# Patient Record
Sex: Male | Born: 1965 | Smoking: Never smoker
Health system: Southern US, Community
[De-identification: ages and names within clinical notes are randomized; demographics above are authoritative.]

## PROBLEM LIST (undated history)

## (undated) DIAGNOSIS — E119 Type 2 diabetes mellitus without complications: Secondary | ICD-10-CM

---

## 2015-06-11 ENCOUNTER — Emergency Department (HOSPITAL_COMMUNITY)
Admission: EM | Admit: 2015-06-11 | Discharge: 2015-06-12 | Disposition: A | Discharge status: Home / Self Care | Payer: Self-pay | Attending: Emergency Medicine | Admitting: Emergency Medicine

## 2015-06-11 ENCOUNTER — Encounter (HOSPITAL_COMMUNITY): Payer: Self-pay | Admitting: Family Medicine

## 2015-06-11 DIAGNOSIS — M79671 Pain in right foot: Secondary | ICD-10-CM | POA: Insufficient documentation

## 2015-06-11 DIAGNOSIS — E1165 Type 2 diabetes mellitus with hyperglycemia: Secondary | ICD-10-CM | POA: Insufficient documentation

## 2015-06-11 DIAGNOSIS — Z79899 Other long term (current) drug therapy: Secondary | ICD-10-CM | POA: Insufficient documentation

## 2015-06-11 DIAGNOSIS — G629 Polyneuropathy, unspecified: Secondary | ICD-10-CM | POA: Insufficient documentation

## 2015-06-11 DIAGNOSIS — R42 Dizziness and giddiness: Secondary | ICD-10-CM | POA: Insufficient documentation

## 2015-06-11 HISTORY — DX: Type 2 diabetes mellitus without complications: E11.9

## 2015-06-11 LAB — CBC
HCT: 42.6 % (ref 39.0–52.0)
Hemoglobin: 14.3 g/dL (ref 13.0–17.0)
MCH: 29.6 pg (ref 26.0–34.0)
MCHC: 33.6 g/dL (ref 30.0–36.0)
MCV: 88.2 fL (ref 78.0–100.0)
PLATELETS: 183 10*3/uL (ref 150–400)
RBC: 4.83 MIL/uL (ref 4.22–5.81)
RDW: 12.8 % (ref 11.5–15.5)
WBC: 7.3 10*3/uL (ref 4.0–10.5)

## 2015-06-11 LAB — URINE MICROSCOPIC-ADD ON: Bacteria, UA: NONE SEEN

## 2015-06-11 LAB — BASIC METABOLIC PANEL
Anion gap: 6 (ref 5–15)
BUN: 17 mg/dL (ref 6–20)
CALCIUM: 8.6 mg/dL — AB (ref 8.9–10.3)
CHLORIDE: 98 mmol/L — AB (ref 101–111)
CO2: 28 mmol/L (ref 22–32)
CREATININE: 0.87 mg/dL (ref 0.61–1.24)
GFR calc non Af Amer: >60 mL/min (ref >60)
Glucose, Bld: 189 mg/dL — ABNORMAL HIGH (ref 65–99)
Potassium: 3.9 mmol/L (ref 3.5–5.1)
Sodium: 132 mmol/L — ABNORMAL LOW (ref 135–145)

## 2015-06-11 LAB — URINALYSIS, ROUTINE W REFLEX MICROSCOPIC
BILIRUBIN URINE: NEGATIVE
GLUCOSE, UA: NEGATIVE mg/dL
KETONES UR: NEGATIVE mg/dL
Nitrite: NEGATIVE
PROTEIN: NEGATIVE mg/dL
Specific Gravity, Urine: 1.024 (ref 1.005–1.030)
pH: 6.5 (ref 5.0–8.0)

## 2015-06-11 LAB — CBG MONITORING, ED
GLUCOSE-CAPILLARY: 194 mg/dL — AB (ref 65–99)
Glucose-Capillary: 154 mg/dL — ABNORMAL HIGH (ref 65–99)

## 2015-06-11 MED ORDER — SODIUM CHLORIDE 0.9 % IV BOLUS (SEPSIS)
1000.0000 mL | Freq: Once | INTRAVENOUS | Status: AC
  Administered 2015-06-12: 1000 mL via INTRAVENOUS

## 2015-06-11 MED ORDER — GABAPENTIN 300 MG PO CAPS
300.0000 mg | ORAL_CAPSULE | Freq: Once | ORAL | Status: AC
  Administered 2015-06-12: 300 mg via ORAL
  Filled 2015-06-11: qty 1

## 2015-06-11 MED ORDER — HYDROCODONE-ACETAMINOPHEN 5-325 MG PO TABS
1.0000 | ORAL_TABLET | Freq: Once | ORAL | Status: AC
  Administered 2015-06-12: 1 via ORAL
  Filled 2015-06-11: qty 1

## 2015-06-11 NOTE — ED Notes (Signed)
CBG was 154. 

## 2015-06-11 NOTE — ED Notes (Signed)
Pt here for hyperglycemia, sts pain in feet and dizziness. sts all since yesterday. sts hasnt checked CBG and think it is high.

## 2015-06-11 NOTE — ED Provider Notes (Signed)
CSN: [id number]   Arrival date & time 06/11/15 1754  History  By signing my name below, I, Chase Jackson, attest that this documentation has been prepared under the direction and in the presence of Shon Baton, MD. Electronically Signed: [name], ED Scribe. 06/11/2015. 11:38 PM.  Chief Complaint  Patient presents with  . Hyperglycemia  . Dizziness  . Foot Pain    HPI The history is provided by the patient and a relative. A language interpreter was used.   Chase Jackson is a 49 y.o. male with history of DM who presents to the Emergency Department complaining of new dizziness with onset yesterday. The dizziness is not exacerbated with turning the head. Pt states that he has been eating and drinking well.  He associates his symptoms with hyperglycemia.  Pt has had atraumatic burning pain in the right foot for 2 months. He denies history of diabetic neuropathy. Pt denies recent fever or illness, chest pain, abdominal pain, n/v/d, weakness, numbness, difficulty speaking, and gait difficulty.  Pt is Arabic-speaking and the history is provided with the aid of a relative at bedside and a tele-interpreter.  Past Medical History  Diagnosis Date  . Diabetes mellitus without complication (HCC)     History reviewed. No pertinent past surgical history.  History reviewed. No pertinent family history.  Social History  Substance Use Topics  . Smoking status: Never Smoker   . Smokeless tobacco: None  . Alcohol Use: None     Review of Systems  Constitutional: Negative for fever.  Respiratory: Negative for chest tightness and shortness of breath.   Cardiovascular: Negative for chest pain.  Gastrointestinal: Negative for nausea, vomiting, abdominal pain and diarrhea.  Musculoskeletal: Positive for arthralgias (Right foot). Negative for gait problem.  Skin: Negative for color change and wound.  Neurological: Positive for dizziness. Negative for speech difficulty, weakness and  numbness.  All other systems reviewed and are negative.  Home Medications   Prior to Admission medications   Medication Sig Start Date End Date Taking? Authorizing Provider  metFORMIN (GLUCOPHAGE) 500 MG tablet Take 500 mg by mouth 2 (two) times daily with a meal.   Yes Historical Provider, MD  gabapentin (NEURONTIN) 300 MG capsule Take 1 capsule (300 mg total) by mouth 2 (two) times daily. 06/12/15   Shon Baton, MD  HYDROcodone-acetaminophen (NORCO/VICODIN) 5-325 MG tablet Take 1 tablet by mouth every 6 (six) hours as needed for moderate pain. 06/12/15   Shon Baton, MD    Allergies  Review of patient's allergies indicates no known allergies.  Triage Vitals: BP 137/80 mmHg  Pulse 55  Temp(Src) 98.7 F (37.1 C) (Oral)  Resp 17  Wt 176 lb 5.9 oz (80 kg)  SpO2 98%  Physical Exam  Constitutional: He is oriented to person, place, and time. He appears well-developed and well-nourished. No distress.  HENT:  Head: Normocephalic and atraumatic.  Mouth/Throat: Oropharynx is clear and moist.  Eyes: EOM are normal. Pupils are equal, round, and reactive to light.  Neck: Neck supple.  Cardiovascular: Normal rate, regular rhythm and normal heart sounds.   No murmur heard. Pulmonary/Chest: Effort normal and breath sounds normal. No respiratory distress. He has no wheezes.  Abdominal: Soft. Bowel sounds are normal. There is no tenderness. There is no rebound.  Musculoskeletal: He exhibits no edema.  Tenderness to palpation over the right heel  Neurological: He is alert and oriented to person, place, and time.  Cranial nerves II through XII intact, 5 out of  5 grip strength bilaterally, no drift, no dysmetria to finger-nose-finger  Skin: Skin is warm and dry.  Psychiatric: He has a normal mood and affect.  Nursing note and vitals reviewed.   ED Course  Procedures   DIAGNOSTIC STUDIES: Oxygen Saturation is 98% on RA, normal by my interpretation.    COORDINATION OF  CARE: 11:38 PM Discussed treatment plan which includes lab work and EKG with pt at bedside and pt agreed to plan.  Labs Reviewed  BASIC METABOLIC PANEL - Abnormal; Notable for the following:    Sodium 132 (*)    Chloride 98 (*)    Glucose, Bld 189 (*)    Calcium 8.6 (*)    All other components within normal limits  URINALYSIS, ROUTINE W REFLEX MICROSCOPIC (NOT AT [facility]) - Abnormal; Notable for the following:    Hgb urine dipstick TRACE (*)    Leukocytes, UA SMALL (*)    All other components within normal limits  URINE MICROSCOPIC-ADD ON - Abnormal; Notable for the following:    Squamous Epithelial / LPF 0-5 (*)    All other components within normal limits  CBG MONITORING, ED - Abnormal; Notable for the following:    Glucose-Capillary 194 (*)    All other components within normal limits  CBG MONITORING, ED - Abnormal; Notable for the following:    Glucose-Capillary 154 (*)    All other components within normal limits  CBC    Imaging Review Dg Foot Complete Right  06/12/2015  CLINICAL DATA:  Acute onset of right heel pain.  Initial encounter. EXAM: RIGHT FOOT COMPLETE - 3+ VIEW COMPARISON:  None. FINDINGS: There is no evidence of fracture or dislocation. The joint spaces are preserved. There is no evidence of talar subluxation; the subtalar joint is unremarkable in appearance. No significant soft tissue abnormalities are seen. IMPRESSION: No evidence of fracture or dislocation. Electronically Signed   By: Roanna Raider M.D.   On: 06/12/2015 01:46    I personally reviewed and evaluated these lab results as a part of my medical decision-making.   EKG Interpretation  Date/Time:  Tuesday June 12 2015 00:01:09 EST Ventricular Rate:  47 PR Interval:  140 QRS Duration: 136 QT Interval:  480 QTC Calculation: 424 R Axis:   37 Text Interpretation:  Sinus bradycardia Left atrial enlargement Right bundle branch block ST elev, probable normal early repol pattern NO prior for  comparison Confirmed by Dallyn Bergland  MD, Alizea Pell ([id number]) on 06/12/2015 12:28:17 AM    MDM   Final diagnoses:  Dizziness  Neuropathy (HCC)    Patient presents with dizziness and right foot pain. Nontoxic on exam. Vital signs reassuring. Basic labwork obtained and largely reassuring. He may be mildly hyponatremic and dehydrated. Patient was given fluids. No evidence of orthostasis. Neurologically intact. Urine has 6-30 white cells but with squamous cells as well. This may be a dirty catch. Patient does not have any urinary symptoms. Plain films of the right foot are negative for bone spur area to suspect diabetic neuropathy. Patient improved after fluids and gabapentin. Discussed results with the patient's. Low suspicion at this time for vertigo or stroke. Suspect mild dehydration and diabetic neuropathy. Discussed this with patient. Follow-up with primary physician.  After history, exam, and medical workup I feel the patient has been appropriately medically screened and is safe for discharge home. Pertinent diagnoses were discussed with the patient. Patient was given return precautions.  I personally performed the services described in this documentation, which was scribed in my  presence. The recorded information has been reviewed and is accurate.    Shon Batonourtney F Breella Vanostrand, MD 06/12/15 0330

## 2015-06-12 ENCOUNTER — Emergency Department (HOSPITAL_COMMUNITY): Payer: Self-pay

## 2015-06-12 MED ORDER — HYDROCODONE-ACETAMINOPHEN 5-325 MG PO TABS: 1.0000 | ORAL_TABLET | Freq: Four times a day (QID) | ORAL | Status: AC | PRN

## 2015-06-12 MED ORDER — GABAPENTIN 300 MG PO CAPS: 300.0000 mg | ORAL_CAPSULE | Freq: Two times a day (BID) | ORAL | Status: AC

## 2015-06-12 NOTE — ED Notes (Signed)
Pt stable, ambulatory, states understanding of discharge instructions 

## 2015-06-12 NOTE — Discharge Instructions (Signed)
Neuropathic Pain Neuropathic pain is pain caused by damage to the nerves that are responsible for certain sensations in your body (sensory nerves). The pain can be caused by damage to:   The sensory nerves that send signals to your spinal cord and brain (peripheral nervous system).  The sensory nerves in your brain or spinal cord (central nervous system). Neuropathic pain can make you more sensitive to pain. What would be a minor sensation for most people may feel very painful if you have neuropathic pain. This is usually a long-term condition that can be difficult to treat. The type of pain can differ from person to person. It may start suddenly (acute), or it may develop slowly and last for a long time (chronic). Neuropathic pain may come and go as damaged nerves heal or may stay at the same level for years. It often causes emotional distress, loss of sleep, and a lower quality of life. CAUSES  The most common cause of damage to a sensory nerve is diabetes. Many other diseases and conditions can also cause neuropathic pain. Causes of neuropathic pain can be classified as:  Toxic. Many drugs and chemicals can cause toxic damage. The most common cause of toxic neuropathic pain is damage from drug treatment for cancer (chemotherapy).  Metabolic. This type of pain can happen when a disease causes imbalances that damage nerves. Diabetes is the most common of these diseases. Vitamin B deficiency caused by long-term alcohol abuse is another common cause.  Traumatic. Any injury that cuts, crushes, or stretches a nerve can cause damage and pain. A common example is feeling pain after losing an arm or leg (phantom limb pain).  Compression-related. If a sensory nerve gets trapped or compressed for a long period of time, the blood supply to the nerve can be cut off.  Vascular. Many blood vessel diseases can cause neuropathic pain by decreasing blood supply and oxygen to nerves.  Autoimmune. This type of  pain results from diseases in which the body's defense system mistakenly attacks sensory nerves. Examples of autoimmune diseases that can cause neuropathic pain include lupus and multiple sclerosis.  Infectious. Many types of viral infections can damage sensory nerves and cause pain. Shingles infection is a common cause of this type of pain.  Inherited. Neuropathic pain can be a symptom of many diseases that are passed down through families (genetic). SIGNS AND SYMPTOMS  The main symptom is pain. Neuropathic pain is often described as:  Burning.  Shock-like.  Stinging.  Hot or cold.  Itching. DIAGNOSIS  No single test can diagnose neuropathic pain. Your health care provider will do a physical exam and ask you about your pain. You may use a pain scale to describe how bad your pain is. You may also have tests to see if you have a high sensitivity to pain and to help find the cause and location of any sensory nerve damage. These tests may include:  Imaging studies, such as:  X-rays.  CT scan.  MRI.  Nerve conduction studies to test how well nerve signals travel through your sensory nerves (electrodiagnostic testing).  Stimulating your sensory nerves through electrodes on your skin and measuring the response in your spinal cord and brain (somatosensory evoked potentials). TREATMENT  Treatment for neuropathic pain may change over time. You may need to try different treatment options or a combination of treatments. Some options include:  Over-the-counter pain relievers.  Prescription medicines. Some medicines used to treat other conditions may also help neuropathic pain. These  include medicines to:  Control seizures (anticonvulsants).  Relieve depression (antidepressants).  Prescription-strength pain relievers (narcotics). These are usually used when other pain relievers do not help.  Transcutaneous nerve stimulation (TENS). This uses electrical currents to block painful nerve  signals. The treatment is painless.  Topical and local anesthetics. These are medicines that numb the nerves. They can be injected as a nerve block or applied to the skin.  Alternative treatments, such as:  Acupuncture.  Meditation.  Massage.  Physical therapy.  Pain management programs.  Counseling. HOME CARE INSTRUCTIONS  Learn as much as you can about your condition.  Take medicines only as directed by your health care provider.  Work closely with all your health care providers to find what works best for you.  Have a good support system at home.  Consider joining a chronic pain support group. SEEK MEDICAL CARE IF:  Your pain treatments are not helping.  You are having side effects from your medicines.  You are struggling with fatigue, mood changes, depression, or anxiety.   This information is not intended to replace advice given to you by your health care provider. Make sure you discuss any questions you have with your health care provider.   Document Released: 03/27/2004 Document Revised: 07/21/2014 Document Reviewed: 12/08/2013 Elsevier Interactive Patient Education 2016 [employer] Dizziness Dizziness is a common problem. It is a feeling of unsteadiness or light-headedness. You may feel like you are about to faint. Dizziness can lead to injury if you stumble or fall. Anyone can become dizzy, but dizziness is more common in older adults. This condition can be caused by a number of things, including medicines, dehydration, or illness. HOME CARE INSTRUCTIONS Taking these steps may help with your condition: Eating and Drinking  Drink enough fluid to keep your urine clear or pale yellow. This helps to keep you from becoming dehydrated. Try to drink more clear fluids, such as water.  Do not drink alcohol.  Limit your caffeine intake if directed by your health care provider.  Limit your salt intake if directed by your health care provider. Activity  Avoid  making quick movements.  Rise slowly from chairs and steady yourself until you feel okay.  In the morning, first sit up on the side of the bed. When you feel okay, stand slowly while you hold onto something until you know that your balance is fine.  Move your legs often if you need to stand in one place for a long time. Tighten and relax your muscles in your legs while you are standing.  Do not drive or operate heavy machinery if you feel dizzy.  Avoid bending down if you feel dizzy. Place items in your home so that they are easy for you to reach without leaning over. Lifestyle  Do not use any tobacco products, including cigarettes, chewing tobacco, or electronic cigarettes. If you need help quitting, ask your health care provider.  Try to reduce your stress level, such as with yoga or meditation. Talk with your health care provider if you need help. General Instructions  Watch your dizziness for any changes.  Take medicines only as directed by your health care provider. Talk with your health care provider if you think that your dizziness is caused by a medicine that you are taking.  Tell a friend or a family member that you are feeling dizzy. If he or she notices any changes in your behavior, have this person call your health care provider.  Keep all follow-up  visits as directed by your health care provider. This is important. SEEK MEDICAL CARE IF:  Your dizziness does not go away.  Your dizziness or light-headedness gets worse.  You feel nauseous.  You have reduced hearing.  You have new symptoms.  You are unsteady on your feet or you feel like the room is spinning. SEEK IMMEDIATE MEDICAL CARE IF:  You vomit or have diarrhea and are unable to eat or drink anything.  You have problems talking, walking, swallowing, or using your arms, hands, or legs.  You feel generally weak.  You are not thinking clearly or you have trouble forming sentences. It may take a friend or  family member to notice this.  You have chest pain, abdominal pain, shortness of breath, or sweating.  Your vision changes.  You notice any bleeding.  You have a headache.  You have neck pain or a stiff neck.  You have a fever.   This information is not intended to replace advice given to you by your health care provider. Make sure you discuss any questions you have with your health care provider.   Document Released: 12/24/2000 Document Revised: 11/14/2014 Document Reviewed: 06/26/2014 Elsevier Interactive Patient Education [employer]

## 2017-04-02 IMAGING — CR DG FOOT COMPLETE 3+V*R*
3 series · 3 of 3 positions shown · non-contrast
Comparison: None.

CLINICAL DATA: Acute onset of right heel pain.  Initial encounter.

EXAM:
RIGHT FOOT COMPLETE - 3+ VIEW

[foot ap]
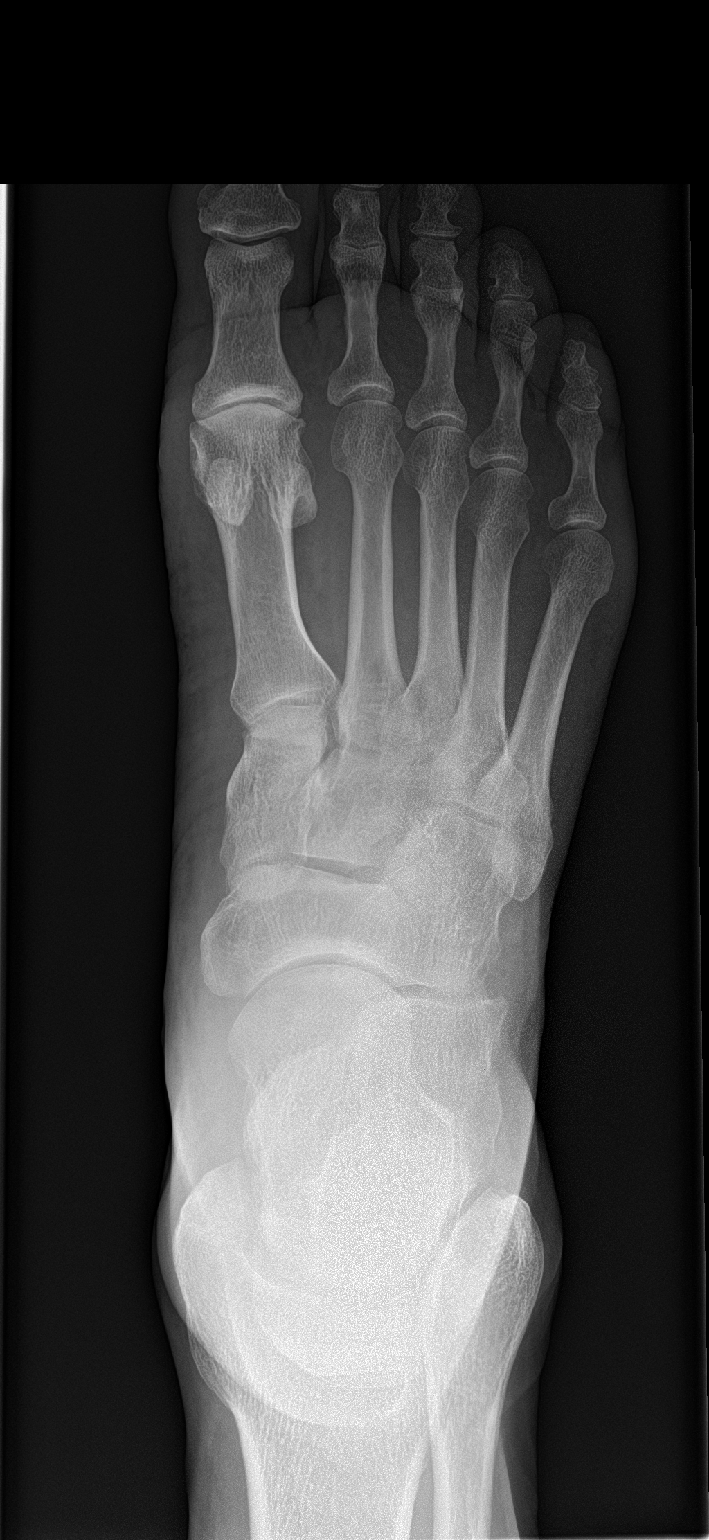

[foot obl]
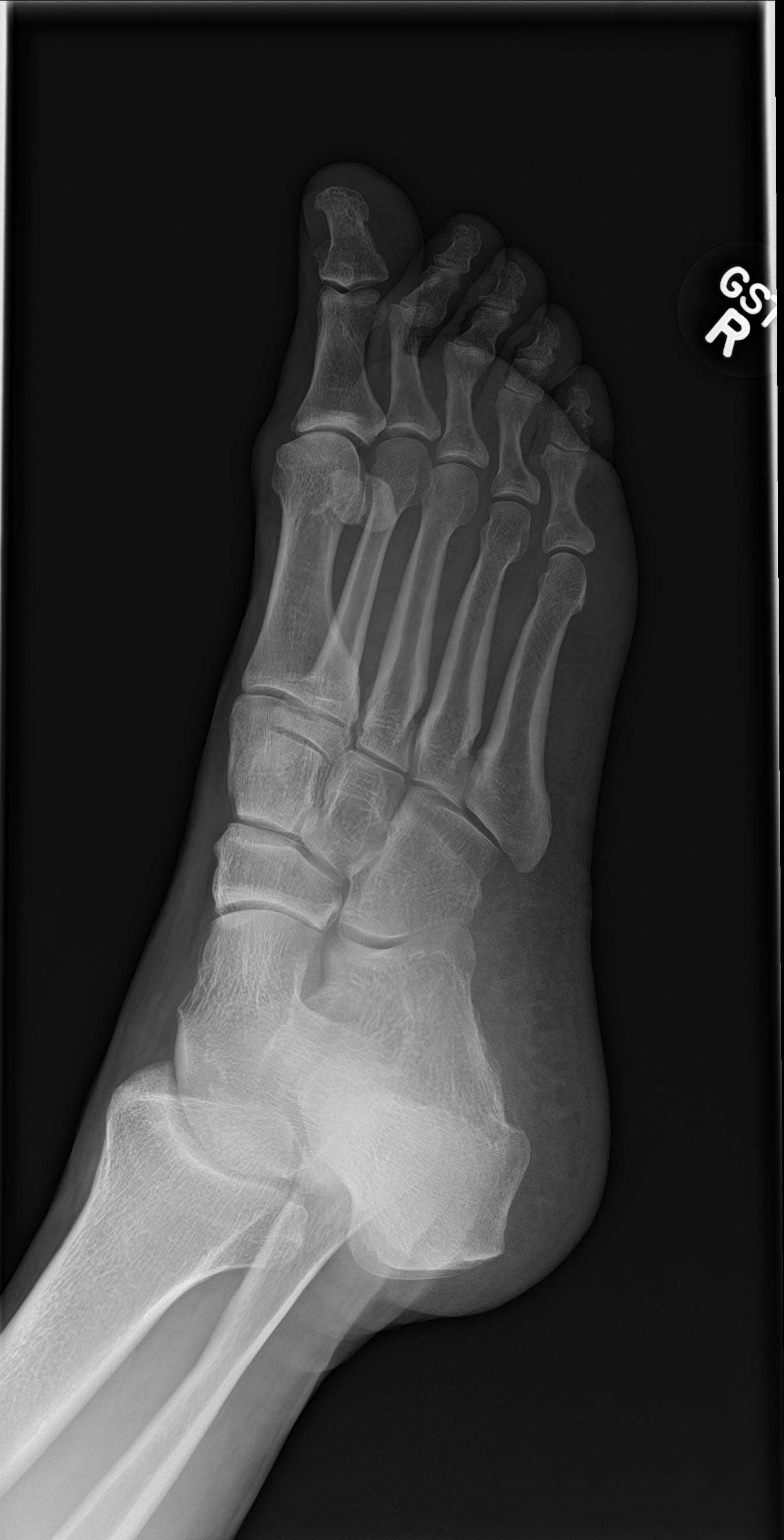

[foot lat]
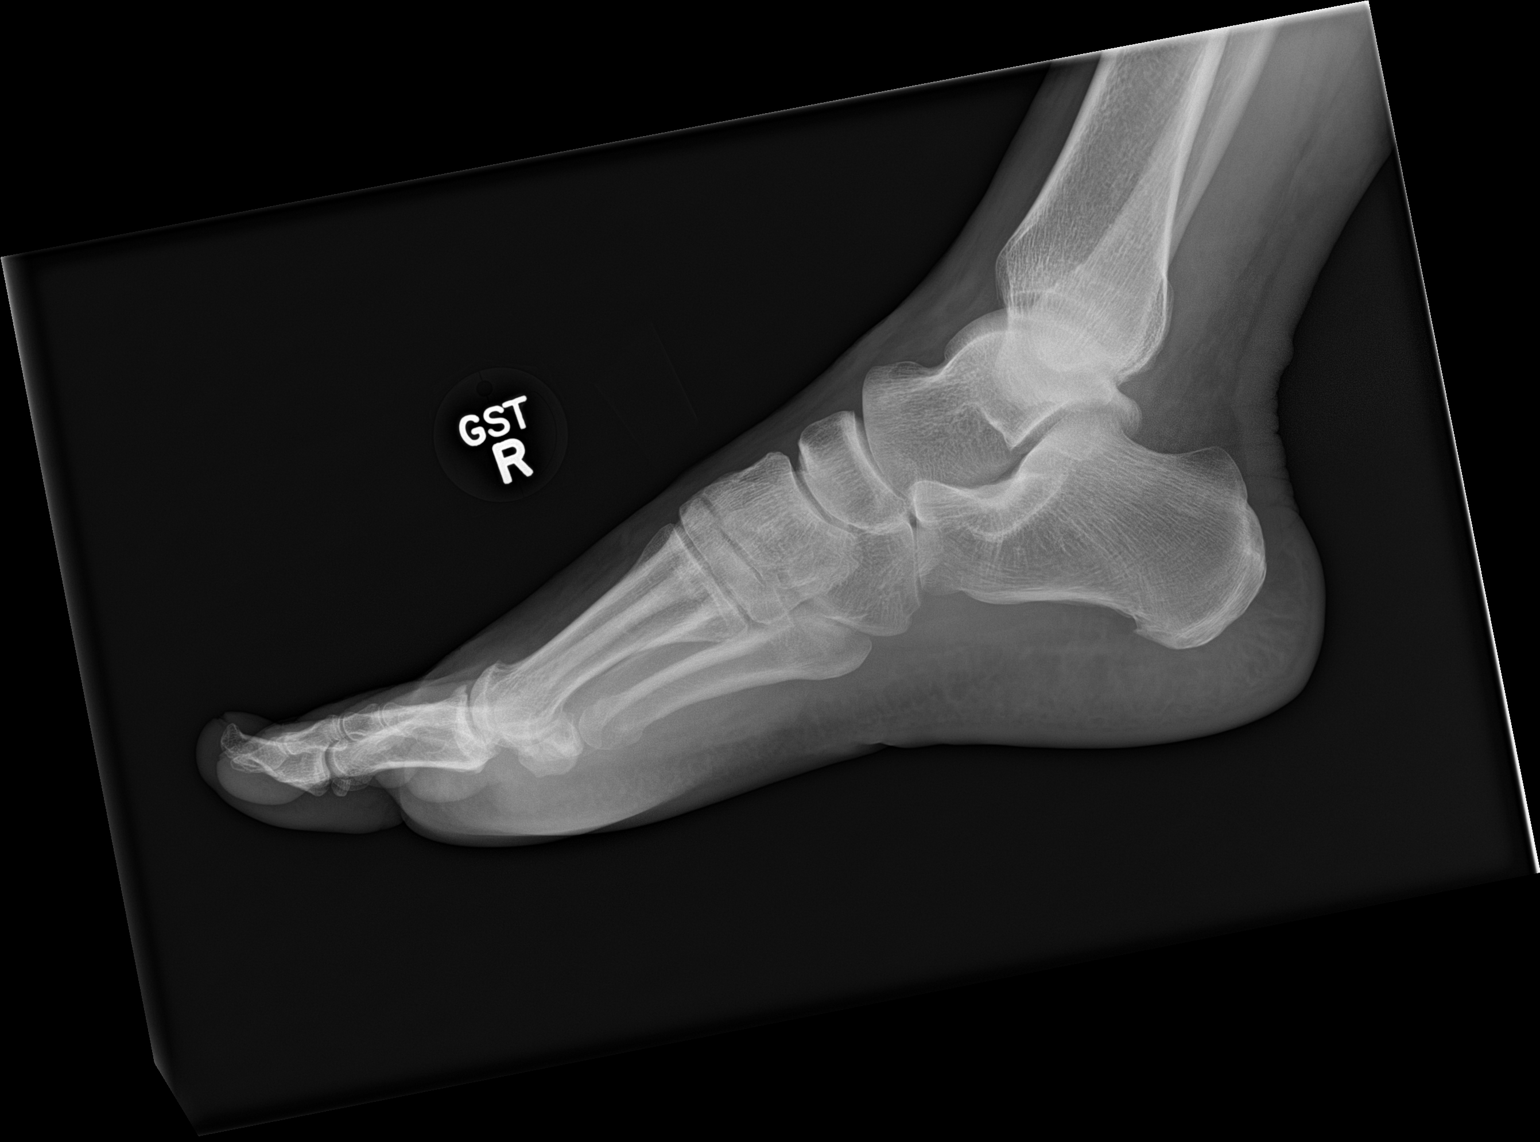

[3 of 3 positions shown; findings below may reference images not displayed]

FINDINGS: There is no evidence of fracture or dislocation. The joint spaces
are preserved. There is no evidence of talar subluxation; the
subtalar joint is unremarkable in appearance.

No significant soft tissue abnormalities are seen.
IMPRESSION: No evidence of fracture or dislocation.
